# Patient Record
Sex: Female | Born: 1995 | Race: White | Hispanic: No | Marital: Married | Smoking: Current every day smoker
Health system: Southern US, Community
[De-identification: ages and names within clinical notes are randomized; demographics above are authoritative.]

## PROBLEM LIST (undated history)

## (undated) DIAGNOSIS — J45909 Unspecified asthma, uncomplicated: Secondary | ICD-10-CM

---
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
## 2015-06-05 ENCOUNTER — Emergency Department (HOSPITAL_COMMUNITY): Payer: Managed Care, Other (non HMO)

## 2015-06-05 ENCOUNTER — Encounter (HOSPITAL_COMMUNITY): Payer: Self-pay | Admitting: Emergency Medicine

## 2015-06-05 ENCOUNTER — Emergency Department (HOSPITAL_COMMUNITY)
Admission: EM | Admit: 2015-06-05 | Discharge: 2015-06-05 | Disposition: A | Discharge status: Home / Self Care | Payer: Managed Care, Other (non HMO) | Attending: Emergency Medicine | Admitting: Emergency Medicine

## 2015-06-05 DIAGNOSIS — S62309A Unspecified fracture of unspecified metacarpal bone, initial encounter for closed fracture: Secondary | ICD-10-CM

## 2015-06-05 DIAGNOSIS — Y9289 Other specified places as the place of occurrence of the external cause: Secondary | ICD-10-CM | POA: Insufficient documentation

## 2015-06-05 DIAGNOSIS — S62354A Nondisplaced fracture of shaft of fourth metacarpal bone, right hand, initial encounter for closed fracture: Secondary | ICD-10-CM | POA: Insufficient documentation

## 2015-06-05 DIAGNOSIS — F17200 Nicotine dependence, unspecified, uncomplicated: Secondary | ICD-10-CM | POA: Diagnosis not present

## 2015-06-05 DIAGNOSIS — Y998 Other external cause status: Secondary | ICD-10-CM | POA: Diagnosis not present

## 2015-06-05 DIAGNOSIS — S6991XA Unspecified injury of right wrist, hand and finger(s), initial encounter: Secondary | ICD-10-CM | POA: Diagnosis present

## 2015-06-05 DIAGNOSIS — X58XXXA Exposure to other specified factors, initial encounter: Secondary | ICD-10-CM | POA: Insufficient documentation

## 2015-06-05 DIAGNOSIS — Z79899 Other long term (current) drug therapy: Secondary | ICD-10-CM | POA: Insufficient documentation

## 2015-06-05 DIAGNOSIS — Y9389 Activity, other specified: Secondary | ICD-10-CM | POA: Diagnosis not present

## 2015-06-05 HISTORY — DX: Unspecified asthma, uncomplicated: J45.909

## 2015-06-05 MED ORDER — HYDROCODONE-ACETAMINOPHEN 5-325 MG PO TABS: 1.0000 | ORAL_TABLET | Freq: Four times a day (QID) | ORAL | Status: DC | PRN

## 2015-06-05 MED ORDER — NAPROXEN 500 MG PO TABS
500.0000 mg | ORAL_TABLET | Freq: Two times a day (BID) | ORAL | Status: DC
Start: 2015-06-05 — End: 2022-12-30

## 2015-06-05 MED ORDER — HYDROCODONE-ACETAMINOPHEN 5-325 MG PO TABS
1.0000 | ORAL_TABLET | Freq: Once | ORAL | Status: AC
  Administered 2015-06-05: 1 via ORAL
  Filled 2015-06-05: qty 1

## 2015-06-05 NOTE — ED Notes (Addendum)
Patient states that she was opening a door and she states that she moved wrong, heard a crack, has pain in right 4th finger, right wrist and right forearm.  No deformity noted, but areas are tender to the touch.

## 2015-06-05 NOTE — ED Provider Notes (Signed)
CSN: [id number]     Arrival date & time 06/05/15  9629 History   First MD Initiated Contact with Patient 06/05/15 [phone]     Chief Complaint  Patient presents with  . Finger Injury     (Consider location/radiation/quality/duration/timing/severity/associated sxs/prior Treatment) HPI Comments: Pt comes in with cc of finger injury. She is R handed, and injured the R finger while trying to open a door. Injury occurred prior to ER arrival. Pt has pain at the base of the R ring finger, shooting up and down. No numbness, tingling.  The history is provided by the patient.    Past Medical History  Diagnosis Date  . Asthma    History reviewed. No pertinent past surgical history. No family history on file. Social History  Substance Use Topics  . Smoking status: Current Every Day Smoker  . Smokeless tobacco: None  . Alcohol Use: Yes     Comment: socially   OB History    No data available     Review of Systems  Constitutional: Positive for activity change.  Musculoskeletal: Positive for myalgias and arthralgias.  Skin: Positive for wound.  Hematological: Does not bruise/bleed easily.      Allergies  Review of patient's allergies indicates no known allergies.  Home Medications   Prior to Admission medications   Medication Sig Start Date End Date Taking? Authorizing Provider  PROAIR HFA 108 [phone] Base) MCG/ACT inhaler Inhale 2 puffs into the lungs every 4 (four) hours as needed for wheezing.  05/27/15  Yes Historical Provider, MD  HYDROcodone-acetaminophen (NORCO/VICODIN) 5-325 MG tablet Take 1 tablet by mouth every 6 (six) hours as needed. 06/05/15   Derwood Kaplan, MD  naproxen (NAPROSYN) 500 MG tablet Take 1 tablet (500 mg total) by mouth 2 (two) times daily. 06/05/15   Archer Vise, MD   BP 139/99 mmHg  Pulse 85  Temp(Src) 98.6 F (37 C) (Oral)  Resp 16  Ht 5' (1.524 m)  Wt 176 lb 1 oz (79.861 kg)  BMI 34.38 kg/m2  SpO2 99%  LMP 05/07/2015 (Approximate) Physical Exam   Constitutional: She is oriented to person, place, and time. She appears well-developed.  HENT:  Head: Normocephalic and atraumatic.  Eyes: EOM are normal.  Neck: Normal range of motion. Neck supple.  Cardiovascular: Normal rate.   Pulmonary/Chest: Effort normal.  Abdominal: Bowel sounds are normal.  Musculoskeletal:  Tenderness over the metacarpal region of the R hand. Neurovascularly intact  Neurological: She is alert and oriented to person, place, and time.  Skin: Skin is warm and dry.  Nursing note and vitals reviewed.   ED Course  Procedures (including critical care time) Labs Review Labs Reviewed - No data to display  Imaging Review Dg Forearm Right  06/05/2015  CLINICAL DATA:  Injury opening door tonight with right ring finger pain. Initial encounter. EXAM: RIGHT FOREARM - 2 VIEW COMPARISON:  None. FINDINGS: Dorsal forearm soft tissue swelling is suspected. No forearm fracture or subluxation. Nondisplaced fourth metacarpal shaft fracture as described on dedicated imaging. IMPRESSION: No osseous abnormality at the forearm. Electronically Signed   By: Marnee Spring M.D.   On: 06/05/2015 05:14   Dg Wrist Complete Right  06/05/2015  CLINICAL DATA:  Injury opening door tonight with right ring finger pain. Initial encounter. EXAM: RIGHT WRIST - COMPLETE 3+ VIEW COMPARISON:  None. FINDINGS: Nondisplaced fourth metacarpal shaft fracture. No fracture or malalignment at the wrist. IMPRESSION: 1. Negative wrist. 2. Nondisplaced fourth metacarpal shaft fracture. Electronically Signed   By:  Marnee SpringJonathon  Watts M.D.   On: 06/05/2015 05:12   Dg Hand Complete Right  06/05/2015  CLINICAL DATA:  Injury opening door tonight with right ring finger pain. Initial encounter. EXAM: RIGHT HAND - COMPLETE 3+ VIEW COMPARISON:  None. FINDINGS: Nondisplaced oblique fracture of the fourth metacarpal shaft. There is an additional more proximal component, but no base or CMC involvement is visualized. No  dislocation. IMPRESSION: Nondisplaced fourth metacarpal shaft fracture. Electronically Signed   By: Marnee SpringJonathon  Watts M.D.   On: 06/05/2015 05:11   I have personally reviewed and evaluated these images and lab results as part of my medical decision-making.   EKG Interpretation None      MDM   Final diagnoses:  Metacarpal bone fracture, closed, initial encounter    SPLINT APPLICATION Date/Time: 6:47 AM Authorized by: Derwood KaplanNanavati, Lemont Sitzmann Consent: Verbal consent obtained. Risks and benefits: risks, benefits and alternatives were discussed Consent given by: patient Splint applied by: orthopedic technician Location details: R hand Splint type: Ulnar gutter Supplies used: ace wrap, tape, plaster Post-procedure: The splinted body part was neurovascularly unchanged following the procedure. Patient tolerance: Patient tolerated the procedure well with no immediate complications.    Pt with mechanical injury resulting in the hand fracture. Will d.c with hand f/u. Neurovascularly intact.    Derwood KaplanAnkit Karyss Frese, MD 06/05/15 [phone]

## 2015-06-05 NOTE — Discharge Instructions (Signed)
Metacarpal Fracture °A metacarpal fracture is a break (fracture) of a bone in the hand. Metacarpals are the bones that extend from your knuckles to your wrist. In each hand, you have five metacarpal bones that connect your fingers and your thumb to your wrist. °Some hand fractures have bone pieces that are close together and stable (simple). These fractures may be treated with only a splint or cast. Hand fractures that have many pieces of broken bone (comminuted), unstable bone pieces (displaced), or a bone that breaks through the skin (compound) usually require surgery. °CAUSES °This injury may be caused by: °· A fall. °· A hard, direct hit to your hand. °· An injury that squeezes your knuckle, stretches your finger out of place, or crushes your hand. °RISK FACTORS °This injury is more likely to occur if: °· You play contact sports. °· You have certain bone diseases. °SYMPTOMS  °Symptoms of this type of fracture develop soon after the injury. Symptoms may include: °· Swelling. °· Pain. °· Stiffness. °· Increased pain with movement. °· Bruising. °· Inability to move a finger. °· A shortened finger. °· A finger knuckle that looks sunken in. °· Unusual appearance of the hand or finger (deformity). °DIAGNOSIS  °This injury may be diagnosed based on your signs and symptoms, especially if you had a recent hand injury. Your health care provider will perform a physical exam. He or she may also order X-rays to confirm the diagnosis.  °TREATMENT  °Treatment for this injury depends on the type of fracture you have and how severe it is. Possible treatments include: °· Non-reduction. This can be done if the bone does not need to be moved back into place. The fracture can be casted or splinted as it is.   °· Closed reduction. If your bone is stable and can be moved back into place, you may only need to wear a cast or splint or have buddy taping. °· Closed reduction with internal fixation (CRIF). This is the most common  treatment. You may have this procedure if your bone can be moved back into place but needs more support. Wires, pins, or screws may be inserted through your skin to stabilize the fracture. °· Open reduction with internal fixation (ORIF). This may be needed if your fracture is severe and unstable. It involves surgery to move your bone back into the right position. Screws, wires, or plates are used to stabilize the fracture. °After all procedures, you may need to wear a cast or a splint for several weeks. You will also need to have follow-up X-rays to make sure that the bone is healing well and staying in position. After you no longer need your cast or splint, you may need physical therapy. This will help you to regain full movement and strength in your hand.  °HOME CARE INSTRUCTIONS  °If You Have a Cast: °· Do not stick anything inside the cast to scratch your skin. Doing that increases your risk of infection. °· Check the skin around the cast every day. Report any concerns to your health care provider. You may put lotion on dry skin around the edges of the cast. Do not apply lotion to the skin underneath the cast. °If You Have a Splint: °· Wear it as directed by your health care provider. Remove it only as directed by your health care provider. °· Loosen the splint if your fingers become numb and tingle, or if they turn cold and blue. °Bathing °· Cover the cast or splint with a   watertight plastic bag to protect it from water while you take a bath or a shower. Do not let the cast or splint get wet. Managing Pain, Stiffness, and Swelling  If directed, apply ice to the injured area (if you have a splint, not a cast):  Put ice in a plastic bag.  Place a towel between your skin and the bag.  Leave the ice on for 20 minutes, 2-3 times a day.  Move your fingers often to avoid stiffness and to lessen swelling.  Raise the injured area above the level of your heart while you are sitting or lying  down. Driving  Do not drive or operate heavy machinery while taking pain medicine.  Do not drive while wearing a cast or splint on a hand that you use for driving. Activity  Return to your normal activities as directed by your health care provider. Ask your health care provider what activities are safe for you. General Instructions  Do not put pressure on any part of the cast or splint until it is fully hardened. This may take several hours.  Keep the cast or splint clean and dry.  Do not use any tobacco products, including cigarettes, chewing tobacco, or electronic cigarettes. Tobacco can delay bone healing. If you need help quitting, ask your health care provider.  Take medicines only as directed by your health care provider.  Keep all follow-up visits as directed by your health care provider. This is important. SEEK MEDICAL CARE IF:   Your pain is getting worse.  You have redness, swelling, or pain in the injured area.   You have fluid, blood, or pus coming from under your cast or splint.   You notice a bad smell coming from under your cast or splint.   You have a fever.  SEEK IMMEDIATE MEDICAL CARE IF:   You develop a rash.   You have trouble breathing.   Your skin or nails on your injured hand turn blue or gray even after you loosen your splint.  Your injured hand feels cold or becomes numb even after you loosen your splint.   You develop severe pain under the cast or in your hand.   This information is not intended to replace advice given to you by your health care provider. Make sure you discuss any questions you have with your health care provider.   Document Released: 01/26/2005 Document Revised: 10/17/2014 Document Reviewed: 11/15/2013 Elsevier Interactive Patient Education 2016 [employer]  Cast or Splint Care Casts and splints support injured limbs and keep bones from moving while they heal.  HOME CARE  Keep the cast or splint uncovered during  the drying period.  A plaster cast can take 24 to 48 hours to dry.  A fiberglass cast will dry in less than 1 hour.  Do not rest the cast on anything harder than a pillow for 24 hours.  Do not put weight on your injured limb. Do not put pressure on the cast. Wait for your doctor's approval.  Keep the cast or splint dry.  Cover the cast or splint with a plastic bag during baths or wet weather.  If you have a cast over your chest and belly (trunk), take sponge baths until the cast is taken off.  If your cast gets wet, dry it with a towel or blow dryer. Use the cool setting on the blow dryer.  Keep your cast or splint clean. Wash a dirty cast with a damp cloth.  Do not  put any objects under your cast or splint.  Do not scratch the skin under the cast with an object. If itching is a problem, use a blow dryer on a cool setting over the itchy area.  Do not trim or cut your cast.  Do not take out the padding from inside your cast.  Exercise your joints near the cast as told by your doctor.  Raise (elevate) your injured limb on 1 or 2 pillows for the first 1 to 3 days. GET HELP IF:  Your cast or splint cracks.  Your cast or splint is too tight or too loose.  You itch badly under the cast.  Your cast gets wet or has a soft spot.  You have a bad smell coming from the cast.  You get an object stuck under the cast.  Your skin around the cast becomes red or sore.  You have new or more pain after the cast is put on. GET HELP RIGHT AWAY IF:  You have fluid leaking through the cast.  You cannot move your fingers or toes.  Your fingers or toes turn blue or white or are cool, painful, or puffy (swollen).  You have tingling or lose feeling (numbness) around the injured area.  You have bad pain or pressure under the cast.  You have trouble breathing or have shortness of breath.  You have chest pain.   This information is not intended to replace advice given to you by your  health care provider. Make sure you discuss any questions you have with your health care provider.   Document Released: 05/28/2010 Document Revised: 09/28/2012 Document Reviewed: 08/04/2012 Elsevier Interactive Patient Education [employer]

## 2017-03-10 ENCOUNTER — Other Ambulatory Visit: Payer: Self-pay

## 2017-03-10 ENCOUNTER — Emergency Department (HOSPITAL_COMMUNITY): Payer: Self-pay

## 2017-03-10 ENCOUNTER — Emergency Department (HOSPITAL_COMMUNITY)
Admission: EM | Admit: 2017-03-10 | Discharge: 2017-03-11 | Disposition: A | Discharge status: Home / Self Care | Payer: Self-pay | Attending: Emergency Medicine | Admitting: Emergency Medicine

## 2017-03-10 DIAGNOSIS — F17200 Nicotine dependence, unspecified, uncomplicated: Secondary | ICD-10-CM | POA: Insufficient documentation

## 2017-03-10 DIAGNOSIS — J4521 Mild intermittent asthma with (acute) exacerbation: Secondary | ICD-10-CM | POA: Insufficient documentation

## 2017-03-10 DIAGNOSIS — J069 Acute upper respiratory infection, unspecified: Secondary | ICD-10-CM | POA: Insufficient documentation

## 2017-03-10 LAB — CBC WITH DIFFERENTIAL/PLATELET
Basophils Absolute: 0 10*3/uL (ref 0.0–0.1)
Basophils Relative: 0 %
EOS ABS: 0 10*3/uL (ref 0.0–0.7)
Eosinophils Relative: 0 %
HCT: 42.8 % (ref 36.0–46.0)
Hemoglobin: 14.8 g/dL (ref 12.0–15.0)
LYMPHS ABS: 1.1 10*3/uL (ref 0.7–4.0)
Lymphocytes Relative: 17 %
MCH: 30.6 pg (ref 26.0–34.0)
MCHC: 34.6 g/dL (ref 30.0–36.0)
MCV: 88.4 fL (ref 78.0–100.0)
MONOS PCT: 9 %
Monocytes Absolute: 0.6 10*3/uL (ref 0.1–1.0)
Neutro Abs: 4.7 10*3/uL (ref 1.7–7.7)
Neutrophils Relative %: 74 %
Platelets: 175 10*3/uL (ref 150–400)
RBC: 4.84 MIL/uL (ref 3.87–5.11)
RDW: 13.9 % (ref 11.5–15.5)
WBC: 6.5 10*3/uL (ref 4.0–10.5)

## 2017-03-10 LAB — URINALYSIS, ROUTINE W REFLEX MICROSCOPIC
Bilirubin Urine: NEGATIVE
GLUCOSE, UA: NEGATIVE mg/dL
Hgb urine dipstick: NEGATIVE
Ketones, ur: NEGATIVE mg/dL
Leukocytes, UA: NEGATIVE
Nitrite: NEGATIVE
PH: 5 (ref 5.0–8.0)
PROTEIN: NEGATIVE mg/dL
Specific Gravity, Urine: 1.031 — ABNORMAL HIGH (ref 1.005–1.030)

## 2017-03-10 LAB — BASIC METABOLIC PANEL
Anion gap: 11 (ref 5–15)
BUN: 8 mg/dL (ref 6–20)
CO2: 21 mmol/L — ABNORMAL LOW (ref 22–32)
CREATININE: 0.97 mg/dL (ref 0.44–1.00)
Calcium: 9 mg/dL (ref 8.9–10.3)
Chloride: 105 mmol/L (ref 101–111)
GFR calc Af Amer: >60 mL/min (ref >60)
Glucose, Bld: 113 mg/dL — ABNORMAL HIGH (ref 65–99)
Potassium: 3.5 mmol/L (ref 3.5–5.1)
SODIUM: 137 mmol/L (ref 135–145)

## 2017-03-10 LAB — I-STAT BETA HCG BLOOD, ED (MC, WL, AP ONLY)

## 2017-03-10 MED ORDER — PREDNISONE 20 MG PO TABS
60.0000 mg | ORAL_TABLET | Freq: Once | ORAL | Status: AC
  Administered 2017-03-11: 60 mg via ORAL
  Filled 2017-03-10: qty 3

## 2017-03-10 MED ORDER — ALBUTEROL SULFATE (2.5 MG/3ML) 0.083% IN NEBU: 2.5000 mg | INHALATION_SOLUTION | Freq: Once | RESPIRATORY_TRACT | Status: DC

## 2017-03-10 MED ORDER — ALBUTEROL SULFATE (2.5 MG/3ML) 0.083% IN NEBU
5.0000 mg | INHALATION_SOLUTION | Freq: Once | RESPIRATORY_TRACT | Status: AC
  Administered 2017-03-10: 5 mg via RESPIRATORY_TRACT
  Filled 2017-03-10: qty 6

## 2017-03-10 MED ORDER — IPRATROPIUM-ALBUTEROL 0.5-2.5 (3) MG/3ML IN SOLN
3.0000 mL | Freq: Once | RESPIRATORY_TRACT | Status: AC
  Administered 2017-03-11: 3 mL via RESPIRATORY_TRACT
  Filled 2017-03-10: qty 3

## 2017-03-10 NOTE — ED Triage Notes (Signed)
Pt having generalized body ache 9/10, chest congestion, and asthma attack for the past 3 days using inhalers with no relief.

## 2017-03-11 MED ORDER — PREDNISONE 20 MG PO TABS: 40.0000 mg | ORAL_TABLET | Freq: Every day | ORAL | 0 refills | Status: AC

## 2017-03-11 NOTE — ED Provider Notes (Signed)
[facility] EMERGENCY DEPARTMENT Provider Note   CSN: [id number] Arrival date & time: 03/10/17  2034     History   Chief Complaint Chief Complaint  Patient presents with  . Nasal Congestion    HPI Maria Woodward is a 22 y.o. female with past medical history of asthma, presenting to the ED with 1 day of URI symptoms.  Patient states she began having a dry cough, with congestion, sore throat, and body aches since yesterday.  She states her asthma has been acting up, not improved with albuterol at home.  She denies fever, stating her temperature was 99 F at the highest.  Denies difficulty swallowing, recent antibiotics, or any other complaints.  Patient was given albuterol neb in triage, which she reports provided her with some relief of symptoms, however she is still feeling as though she is not at her baseline.  The history is provided by the patient.    Past Medical History:  Diagnosis Date  . Asthma     There are no active problems to display for this patient.   No past surgical history on file.  OB History    No data available       Home Medications    Prior to Admission medications   Medication Sig Start Date End Date Taking? Authorizing Provider  HYDROcodone-acetaminophen (NORCO/VICODIN) 5-325 MG tablet Take 1 tablet by mouth every 6 (six) hours as needed. 06/05/15   Derwood KaplanNanavati, Ankit, MD  naproxen (NAPROSYN) 500 MG tablet Take 1 tablet (500 mg total) by mouth 2 (two) times daily. 06/05/15   Derwood KaplanNanavati, Ankit, MD  predniSONE (DELTASONE) 20 MG tablet Take 2 tablets (40 mg total) by mouth daily for 5 days. 03/11/17 03/16/17  Rocky Gladden, [place] N, PA-C  PROAIR HFA 108 [phone](90 Base) MCG/ACT inhaler Inhale 2 puffs into the lungs every 4 (four) hours as needed for wheezing.  05/27/15   [provider]    Family History No family history on file.  Social History Social History   Tobacco Use  . Smoking status: Current Every Day Smoker  Substance Use  Topics  . Alcohol use: Yes    Comment: socially  . Drug use: No     Allergies   Patient has no known allergies.   Review of Systems Review of Systems  Constitutional: Negative for chills and fever.  HENT: Positive for congestion and sore throat. Negative for ear pain, trouble swallowing and voice change.   Respiratory: Positive for cough, shortness of breath and wheezing.   Musculoskeletal: Positive for myalgias (generalized).  All other systems reviewed and are negative.    Physical Exam Updated Vital Signs BP (!) 175/101 (BP Location: Right Arm)   Pulse 100   Temp 99 F (37.2 C) (Oral)   Resp 18   Ht 5' (1.524 m)   Wt 81.6 kg (180 lb)   LMP 03/06/2017   SpO2 100%   BMI 35.15 kg/m   Physical Exam  Constitutional: She appears well-developed and well-nourished. No distress.  Tolerating secretions.  No increased work of breathing.  HENT:  Head: Normocephalic and atraumatic.  Right Ear: Tympanic membrane, external ear and ear canal normal.  Left Ear: Tympanic membrane, external ear and ear canal normal.  Nose: Nose normal.  Mouth/Throat: Uvula is midline. No trismus in the jaw. No uvula swelling.  Oropharyngeal erythema, no edema or exudates.  Eyes: Conjunctivae are normal.  Neck: Normal range of motion. Neck supple. No tracheal deviation present.  Cardiovascular: Regular rhythm,  normal heart sounds and intact distal pulses.  Slightly tachycardic  Pulmonary/Chest: Effort normal. No stridor. No respiratory distress. She has wheezes (bilateral). She has no rales.  Abdominal: Soft.  Lymphadenopathy:    She has no cervical adenopathy.  Neurological: She is alert.  Skin: Skin is warm.  Psychiatric: She has a normal mood and affect. Her behavior is normal.  Nursing note and vitals reviewed.    ED Treatments / Results  Labs (all labs ordered are listed, but only abnormal results are displayed) Labs Reviewed  BASIC METABOLIC PANEL - Abnormal; Notable for the  following components:      Result Value   CO2 21 (*)    Glucose, Bld 113 (*)    All other components within normal limits  URINALYSIS, ROUTINE W REFLEX MICROSCOPIC - Abnormal; Notable for the following components:   APPearance CLOUDY (*)    Specific Gravity, Urine 1.031 (*)    Bacteria, UA RARE (*)    Squamous Epithelial / LPF 6-30 (*)    All other components within normal limits  CBC WITH DIFFERENTIAL/PLATELET  I-STAT BETA HCG BLOOD, ED ([facility], [facility], [facility] ONLY)    EKG  EKG Interpretation None       Radiology Dg Chest 2 View  Result Date: 03/10/2017 CLINICAL DATA:  Shortness of breath. Generalized body aches. Chest congestion. Asthma attack for 3 days. No relief with inhalers. EXAM: CHEST  2 VIEW COMPARISON:  08/23/2011 FINDINGS: Normal heart size and pulmonary vascularity. No focal airspace disease or consolidation in the lungs. No blunting of costophrenic angles. No pneumothorax. Mediastinal contours appear intact. Presumed foreign object projected over the right side of the neck. Radiopaque piercings over the breasts. IMPRESSION: No active cardiopulmonary disease. Electronically Signed   By: Burman Nieves M.D.   On: 03/10/2017 21:26    Procedures Procedures (including critical care time)  Medications Ordered in ED Medications  albuterol (PROVENTIL) (2.5 MG/3ML) 0.083% nebulizer solution 5 mg (5 mg Nebulization Given 03/10/17 2054)  ipratropium-albuterol (DUONEB) 0.5-2.5 (3) MG/3ML nebulizer solution 3 mL (3 mLs Nebulization Given 03/11/17 0007)  predniSONE (DELTASONE) tablet 60 mg (60 mg Oral Given 03/11/17 0007)     Initial Impression / Assessment and Plan / ED Course  I have reviewed the triage vital signs and the nursing notes.  Pertinent labs & imaging results that were available during my care of the patient were reviewed by me and considered in my medical decision making (see chart for details).  Clinical Course as of Mar 11 37  Thu Mar 11, 2017  0026 Pt reports  significant improvement in breathing after duoneb. Lungs clear on re-evaluation. Pt tolerating PO fluids in ED.  [JR]    Clinical Course User Index [JR] Jahziah Simonin, [place] N, PA-C    Pt presenting w URI sx and asthma exacerbation. ENT exam reassuring. Lungs w wheezing bilaterally. Tolerating secretions, afebrile, mildly tachycardic. CXR neg. Labs done in triage wnl. Duo neb, prednisone and PO liquids given. On re-evaluation, pt reports she feels much better and is at her baseline for breathing. Wheezing significantly improved on exam with better air movement. O2 sat 100% on RA. Pt drank >8oz liquids in ED with improvement in HR. Pt not in distress, safe for discharge at this time. Will give rx for prednisone for asthma. Also recommend PCP follow up to recheck BP. Agreeable to plan.   Discussed results, findings, treatment and follow up. Patient advised of return precautions. Patient verbalized understanding and agreed with plan.  Final Clinical Impressions(s) /  ED Diagnoses   Final diagnoses:  Exacerbation of intermittent asthma, unspecified asthma severity  Viral URI    ED Discharge Orders        Ordered    predniSONE (DELTASONE) 20 MG tablet  Daily     03/11/17 0038       Lamekia Nolden, [place] N, PA-C 03/11/17 0041    Zadie Rhine, MD 03/11/17 [phone]

## 2017-03-11 NOTE — Discharge Instructions (Signed)
Please read instructions below.  You can take tylenol/acetaminophen or advil/ibuprofen as needed for sore throat or body aches.  You can use saline nasal spray as needed for congestion. Drink plenty of water to stay hydrated. Tomorrow, begin taking the prednisone as prescribed for your asthma. Use your albuterol inhaler every 4-6 hrs as needed. Follow up with your primary care provider if symptoms persist, and to recheck your blood pressure as it was high today. Return to the ER for difficulty swallowing liquids, difficulty breathing, or new or worsening symptoms.

## 2019-06-05 IMAGING — CR DG CHEST 2V
2 series · 2 of 2 positions shown · non-contrast
Comparison: 08/23/2011

CLINICAL DATA: Shortness of breath. Generalized body aches. Chest
congestion. Asthma attack for 3 days. No relief with inhalers.

EXAM:
CHEST  2 VIEW

[chest pa]
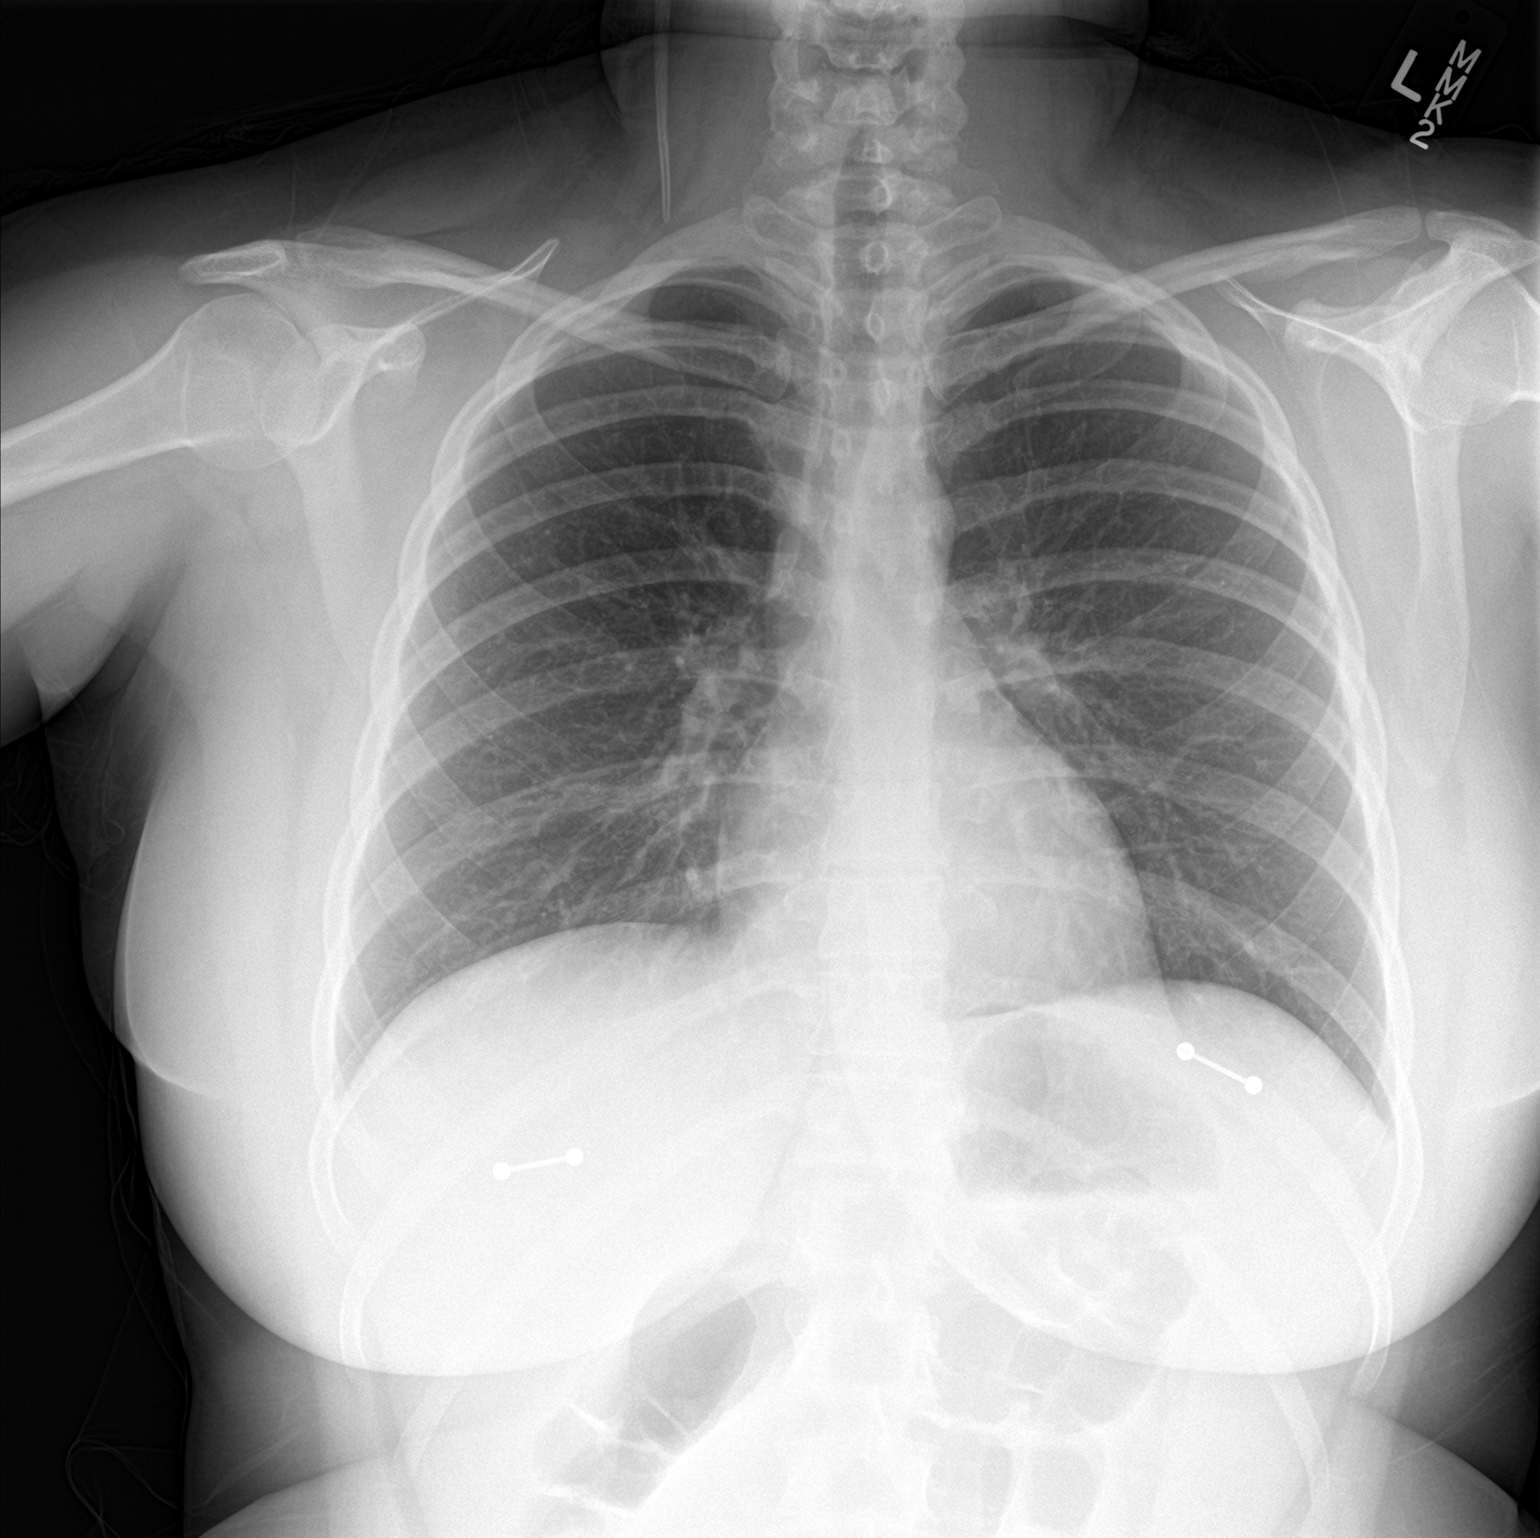

[chest lat]
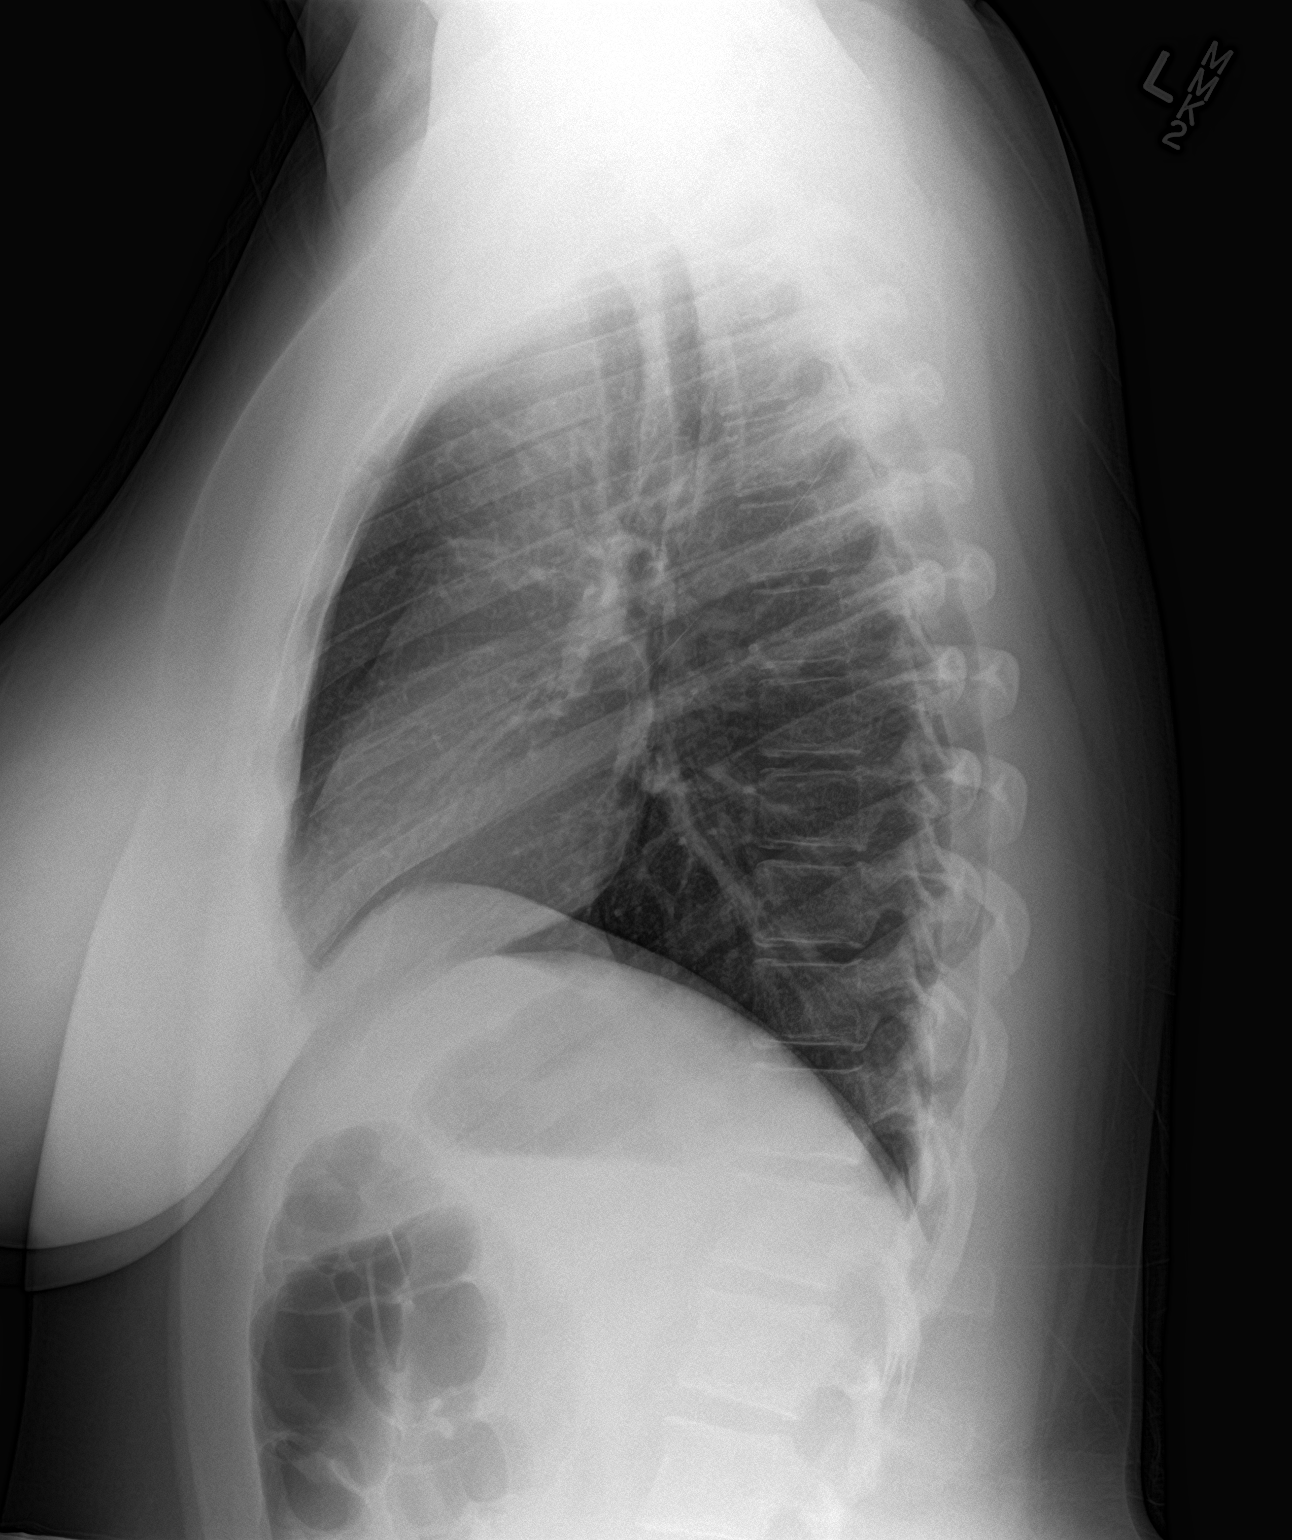

[2 of 2 positions shown; findings below may reference images not displayed]

FINDINGS: Normal heart size and pulmonary vascularity. No focal airspace
disease or consolidation in the lungs. No blunting of costophrenic
angles. No pneumothorax. Mediastinal contours appear intact.
Presumed foreign object projected over the right side of the neck.
Radiopaque piercings over the breasts.
IMPRESSION: No active cardiopulmonary disease.

## 2020-01-22 ENCOUNTER — Ambulatory Visit (HOSPITAL_COMMUNITY)
Admission: EM | Admit: 2020-01-22 | Discharge: 2020-01-22 | Disposition: A | Discharge status: Home / Self Care | Payer: 59 | Attending: Emergency Medicine | Admitting: Emergency Medicine

## 2020-01-22 ENCOUNTER — Other Ambulatory Visit: Payer: Self-pay

## 2020-01-22 ENCOUNTER — Encounter (HOSPITAL_COMMUNITY): Payer: Self-pay

## 2020-01-22 DIAGNOSIS — Z79899 Other long term (current) drug therapy: Secondary | ICD-10-CM | POA: Insufficient documentation

## 2020-01-22 DIAGNOSIS — Z20822 Contact with and (suspected) exposure to covid-19: Secondary | ICD-10-CM | POA: Diagnosis not present

## 2020-01-22 DIAGNOSIS — Z791 Long term (current) use of non-steroidal anti-inflammatories (NSAID): Secondary | ICD-10-CM | POA: Diagnosis not present

## 2020-01-22 DIAGNOSIS — B349 Viral infection, unspecified: Secondary | ICD-10-CM | POA: Insufficient documentation

## 2020-01-22 DIAGNOSIS — F17200 Nicotine dependence, unspecified, uncomplicated: Secondary | ICD-10-CM | POA: Diagnosis not present

## 2020-01-22 DIAGNOSIS — R030 Elevated blood-pressure reading, without diagnosis of hypertension: Secondary | ICD-10-CM | POA: Insufficient documentation

## 2020-01-22 DIAGNOSIS — R112 Nausea with vomiting, unspecified: Secondary | ICD-10-CM | POA: Diagnosis not present

## 2020-01-22 DIAGNOSIS — J45909 Unspecified asthma, uncomplicated: Secondary | ICD-10-CM | POA: Insufficient documentation

## 2020-01-22 LAB — RESP PANEL BY RT-PCR (FLU A&B, COVID) ARPGX2
Influenza A by PCR: NEGATIVE
Influenza B by PCR: NEGATIVE
SARS Coronavirus 2 by RT PCR: NEGATIVE

## 2020-01-22 MED ORDER — ONDANSETRON 4 MG PO TBDP
ORAL_TABLET | ORAL | Status: AC
  Filled 2020-01-22: qty 1

## 2020-01-22 MED ORDER — ONDANSETRON 4 MG PO TBDP
4.0000 mg | ORAL_TABLET | Freq: Once | ORAL | Status: AC
  Administered 2020-01-22: 4 mg via ORAL

## 2020-01-22 MED ORDER — ONDANSETRON HCL 4 MG PO TABS: 4.0000 mg | ORAL_TABLET | Freq: Four times a day (QID) | ORAL | 0 refills | Status: DC | PRN

## 2020-01-22 NOTE — Discharge Instructions (Signed)
Take the antinausea medication as directed.    Keep yourself hydrated with clear liquids, such as water, Gatorade, Pedialyte, Sprite, or ginger ale.    Go to the emergency department if you have acute worsening symptoms.    Your COVID and Flu tests are pending.  You should self quarantine until the test results are back.    Take Tylenol as needed for fever or discomfort.  Rest and keep yourself hydrated.    Follow-up with your primary care provider if your symptoms are not improving.    Your blood pressure is elevated today at 155/108.  Please have this rechecked by your primary care provider in 1-2 weeks.

## 2020-01-22 NOTE — ED Provider Notes (Signed)
[facility]    CSN: [id number] Arrival date & time: 01/22/20  1448      History   Chief Complaint Chief Complaint  Patient presents with  . URI    HPI Maria Woodward is a 24 y.o. female.   Patient presents with 4-5 day history of runny nose, congestion, sinus pressure, cough, and chills.  She also reports nausea, vomiting, decreased oral intake; no emesis today, last episode yesterday.  She denies fever, rash, shortness of breath, abdominal pain, diarrhea, or other symptoms.  Treatment attempted at home with NyQuil.  Her medical history includes asthma.  The history is provided by the patient and medical records.    Past Medical History:  Diagnosis Date  . Asthma     There are no problems to display for this patient.   History reviewed. No pertinent surgical history.  OB History   No obstetric history on file.      Home Medications    Prior to Admission medications   Medication Sig Start Date End Date Taking? Authorizing Provider  HYDROcodone-acetaminophen (NORCO/VICODIN) 5-325 MG tablet Take 1 tablet by mouth every 6 (six) hours as needed. 06/05/15   Derwood Kaplan, MD  naproxen (NAPROSYN) 500 MG tablet Take 1 tablet (500 mg total) by mouth 2 (two) times daily. 06/05/15   Derwood Kaplan, MD  ondansetron (ZOFRAN) 4 MG tablet Take 1 tablet (4 mg total) by mouth every 6 (six) hours as needed for nausea or vomiting. 01/22/20   Mickie Bail, NP  PROAIR HFA 108 [phone] Base) MCG/ACT inhaler Inhale 2 puffs into the lungs every 4 (four) hours as needed for wheezing.  05/27/15   [provider]    Family History History reviewed. No pertinent family history.  Social History Social History   Tobacco Use  . Smoking status: Current Every Day Smoker  Substance Use Topics  . Alcohol use: Yes    Comment: socially  . Drug use: No     Allergies   Patient has no known allergies.   Review of Systems Review of Systems  Constitutional: Positive for  chills. Negative for fever.  HENT: Positive for congestion, postnasal drip, rhinorrhea and sinus pressure. Negative for ear pain and sore throat.   Eyes: Negative for pain and visual disturbance.  Respiratory: Positive for cough. Negative for shortness of breath.   Cardiovascular: Negative for chest pain and palpitations.  Gastrointestinal: Positive for nausea and vomiting. Negative for abdominal pain and diarrhea.  Genitourinary: Negative for dysuria and hematuria.  Musculoskeletal: Negative for arthralgias and back pain.  Skin: Negative for color change and rash.  Neurological: Negative for seizures and syncope.  All other systems reviewed and are negative.    Physical Exam Triage Vital Signs ED Triage Vitals  Enc Vitals Group     BP      Pulse      Resp      Temp      Temp src      SpO2      Weight      Height      Head Circumference      Peak Flow      Pain Score      Pain Loc      Pain Edu?      Excl. in GC?    No data found.  Updated Vital Signs BP (!) 155/108 (BP Location: Right Arm)   Pulse 67   Temp 97.8 F (36.6 C) (Oral)  Resp 17   LMP 01/13/2020   SpO2 100%   Visual Acuity Right Eye Distance:   Left Eye Distance:   Bilateral Distance:    Right Eye Near:   Left Eye Near:    Bilateral Near:     Physical Exam Vitals and nursing note reviewed.  Constitutional:      General: She is not in acute distress.    Appearance: She is well-developed and well-nourished.  HENT:     Head: Normocephalic and atraumatic.     Right Ear: Tympanic membrane normal.     Left Ear: Tympanic membrane normal.     Nose: Nose normal.     Mouth/Throat:     Mouth: Mucous membranes are moist.     Pharynx: Oropharynx is clear.  Eyes:     Conjunctiva/sclera: Conjunctivae normal.  Cardiovascular:     Rate and Rhythm: Normal rate and regular rhythm.     Heart sounds: Normal heart sounds.  Pulmonary:     Effort: Pulmonary effort is normal. No respiratory distress.      Breath sounds: Normal breath sounds.  Abdominal:     General: Bowel sounds are normal.     Palpations: Abdomen is soft.     Tenderness: There is no abdominal tenderness. There is no right CVA tenderness, left CVA tenderness, guarding or rebound.  Musculoskeletal:        General: No edema.     Cervical back: Neck supple.  Skin:    General: Skin is warm and dry.     Findings: No rash.  Neurological:     General: No focal deficit present.     Mental Status: She is alert and oriented to person, place, and time.     Gait: Gait normal.  Psychiatric:        Mood and Affect: Mood and affect and mood normal.        Behavior: Behavior normal.      UC Treatments / Results  Labs (all labs ordered are listed, but only abnormal results are displayed) Labs Reviewed  RESP PANEL BY RT-PCR (FLU A&B, COVID) [id number]    EKG   Radiology No results found.  Procedures Procedures (including critical care time)  Medications Ordered in UC Medications  ondansetron (ZOFRAN-ODT) disintegrating tablet 4 mg (4 mg Oral Given 01/22/20 1801)    Initial Impression / Assessment and Plan / UC Course  I have reviewed the triage vital signs and the nursing notes.  Pertinent labs & imaging results that were available during my care of the patient were reviewed by me and considered in my medical decision making (see chart for details).   Viral illness, nausea with vomiting, elevated blood pressure reading.  Zofran given here and patient able to tolerate oral fluids without difficulty.  Flu and COVID pending.  Instructed patient to stay hydrated with clear liquids.  Treating at home with Zofran.  Tylenol as needed for fever or discomfort.  Instructed patient to follow-up with her PCP if her symptoms are not improving.  Discussed that her blood pressure is elevated today and needs to be rechecked by her PCP in 1 to 2 weeks.  Patient agrees to plan of care.   Final Clinical Impressions(s) / UC Diagnoses    Final diagnoses:  Viral illness  Non-intractable vomiting with nausea, unspecified vomiting type  Elevated blood pressure reading     Discharge Instructions     Take the antinausea medication as directed.    Keep yourself hydrated with  clear liquids, such as water, Gatorade, Pedialyte, Sprite, or ginger ale.    Go to the emergency department if you have acute worsening symptoms.    Your COVID and Flu tests are pending.  You should self quarantine until the test results are back.    Take Tylenol as needed for fever or discomfort.  Rest and keep yourself hydrated.    Follow-up with your primary care provider if your symptoms are not improving.    Your blood pressure is elevated today at 155/108.  Please have this rechecked by your primary care provider in 1-2 weeks.                 ED Prescriptions    Medication Sig Dispense Auth. Provider   ondansetron (ZOFRAN) 4 MG tablet Take 1 tablet (4 mg total) by mouth every 6 (six) hours as needed for nausea or vomiting. 12 tablet Mickie Bail, NP     PDMP not reviewed this encounter.   Mickie Bail, NP 01/22/20 Rickey Primus

## 2020-01-22 NOTE — ED Triage Notes (Signed)
Pt presents with productive cough, congestion, sinus pressure, and vomiting X 1 week.

## 2022-03-04 ENCOUNTER — Telehealth: Payer: Self-pay

## 2022-03-04 NOTE — Telephone Encounter (Signed)
Mychart msg sent. AS, CMA 

## 2022-07-15 DIAGNOSIS — Z87891 Personal history of nicotine dependence: Secondary | ICD-10-CM | POA: Diagnosis not present

## 2022-07-15 DIAGNOSIS — J45909 Unspecified asthma, uncomplicated: Secondary | ICD-10-CM | POA: Diagnosis not present

## 2022-12-30 ENCOUNTER — Emergency Department (HOSPITAL_COMMUNITY): Payer: 59

## 2022-12-30 ENCOUNTER — Other Ambulatory Visit: Payer: Self-pay

## 2022-12-30 ENCOUNTER — Encounter (HOSPITAL_COMMUNITY): Payer: Self-pay

## 2022-12-30 ENCOUNTER — Emergency Department (HOSPITAL_COMMUNITY)
Admission: EM | Admit: 2022-12-30 | Discharge: 2022-12-30 | Disposition: A | Discharge status: Home / Self Care | Payer: 59 | Attending: Emergency Medicine | Admitting: Emergency Medicine

## 2022-12-30 DIAGNOSIS — S161XXA Strain of muscle, fascia and tendon at neck level, initial encounter: Secondary | ICD-10-CM | POA: Diagnosis not present

## 2022-12-30 DIAGNOSIS — M79645 Pain in left finger(s): Secondary | ICD-10-CM | POA: Diagnosis not present

## 2022-12-30 DIAGNOSIS — S199XXA Unspecified injury of neck, initial encounter: Secondary | ICD-10-CM | POA: Diagnosis not present

## 2022-12-30 DIAGNOSIS — S63682A Other sprain of left thumb, initial encounter: Secondary | ICD-10-CM | POA: Diagnosis not present

## 2022-12-30 DIAGNOSIS — S20212A Contusion of left front wall of thorax, initial encounter: Secondary | ICD-10-CM | POA: Diagnosis not present

## 2022-12-30 DIAGNOSIS — R079 Chest pain, unspecified: Secondary | ICD-10-CM | POA: Diagnosis not present

## 2022-12-30 DIAGNOSIS — R0689 Other abnormalities of breathing: Secondary | ICD-10-CM | POA: Diagnosis not present

## 2022-12-30 DIAGNOSIS — I10 Essential (primary) hypertension: Secondary | ICD-10-CM | POA: Diagnosis not present

## 2022-12-30 DIAGNOSIS — Y92410 Unspecified street and highway as the place of occurrence of the external cause: Secondary | ICD-10-CM | POA: Diagnosis not present

## 2022-12-30 DIAGNOSIS — M542 Cervicalgia: Secondary | ICD-10-CM | POA: Diagnosis present

## 2022-12-30 DIAGNOSIS — S20219A Contusion of unspecified front wall of thorax, initial encounter: Secondary | ICD-10-CM | POA: Insufficient documentation

## 2022-12-30 DIAGNOSIS — S63602A Unspecified sprain of left thumb, initial encounter: Secondary | ICD-10-CM | POA: Diagnosis not present

## 2022-12-30 DIAGNOSIS — S0990XA Unspecified injury of head, initial encounter: Secondary | ICD-10-CM | POA: Insufficient documentation

## 2022-12-30 DIAGNOSIS — R109 Unspecified abdominal pain: Secondary | ICD-10-CM | POA: Diagnosis not present

## 2022-12-30 DIAGNOSIS — R000 Tachycardia, unspecified: Secondary | ICD-10-CM | POA: Diagnosis not present

## 2022-12-30 LAB — CBC WITH DIFFERENTIAL/PLATELET
Abs Immature Granulocytes: 0.03 10*3/uL (ref 0.00–0.07)
Basophils Absolute: 0 10*3/uL (ref 0.0–0.1)
Basophils Relative: 0 %
Eosinophils Absolute: 0.1 10*3/uL (ref 0.0–0.5)
Eosinophils Relative: 1 %
HCT: 46.2 % — ABNORMAL HIGH (ref 36.0–46.0)
Hemoglobin: 16.2 g/dL — ABNORMAL HIGH (ref 12.0–15.0)
Immature Granulocytes: 0 %
Lymphocytes Relative: 20 %
Lymphs Abs: 1.6 10*3/uL (ref 0.7–4.0)
MCH: 29.1 pg (ref 26.0–34.0)
MCHC: 35.1 g/dL (ref 30.0–36.0)
MCV: 83.1 fL (ref 80.0–100.0)
Monocytes Absolute: 0.5 10*3/uL (ref 0.1–1.0)
Monocytes Relative: 6 %
Neutro Abs: 5.9 10*3/uL (ref 1.7–7.7)
Neutrophils Relative %: 73 %
Platelets: 260 10*3/uL (ref 150–400)
RBC: 5.56 MIL/uL — ABNORMAL HIGH (ref 3.87–5.11)
RDW: 12.5 % (ref 11.5–15.5)
WBC: 8.1 10*3/uL (ref 4.0–10.5)
nRBC: 0 % (ref 0.0–0.2)

## 2022-12-30 LAB — BASIC METABOLIC PANEL
Anion gap: 14 (ref 5–15)
BUN: 9 mg/dL (ref 6–20)
CO2: 19 mmol/L — ABNORMAL LOW (ref 22–32)
Calcium: 10 mg/dL (ref 8.9–10.3)
Chloride: 103 mmol/L (ref 98–111)
Creatinine, Ser: 0.84 mg/dL (ref 0.44–1.00)
GFR, Estimated: >60 mL/min (ref >60)
Glucose, Bld: 96 mg/dL (ref 70–99)
Potassium: 3.8 mmol/L (ref 3.5–5.1)
Sodium: 136 mmol/L (ref 135–145)

## 2022-12-30 LAB — HCG, SERUM, QUALITATIVE: Preg, Serum: NEGATIVE

## 2022-12-30 MED ORDER — FENTANYL CITRATE PF 50 MCG/ML IJ SOSY
50.0000 ug | PREFILLED_SYRINGE | Freq: Once | INTRAMUSCULAR | Status: AC
  Administered 2022-12-30: 50 ug via INTRAVENOUS
  Filled 2022-12-30: qty 1

## 2022-12-30 MED ORDER — IBUPROFEN 800 MG PO TABS: 800.0000 mg | ORAL_TABLET | Freq: Three times a day (TID) | ORAL | 0 refills | Status: AC | PRN

## 2022-12-30 MED ORDER — METHOCARBAMOL 500 MG PO TABS: 500.0000 mg | ORAL_TABLET | Freq: Two times a day (BID) | ORAL | 0 refills | Status: AC | PRN

## 2022-12-30 MED ORDER — IOHEXOL 350 MG/ML SOLN
75.0000 mL | Freq: Once | INTRAVENOUS | Status: AC | PRN
  Administered 2022-12-30: 75 mL via INTRAVENOUS
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
## 2022-12-30 NOTE — Progress Notes (Signed)
Orthopedic Tech Progress Note Patient Details:  Maria Woodward [birth year] [id number]  Ortho Devices Type of Ortho Device: Thumb velcro splint Ortho Device/Splint Location: LUE Ortho Device/Splint Interventions: Ordered, Application, Adjustment   Post Interventions Patient Tolerated: Well Instructions Provided: Adjustment of device, Care of device  Tonye Pearson 12/30/2022, 12:08 PM
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
## 2023-08-25 ENCOUNTER — Emergency Department (HOSPITAL_BASED_OUTPATIENT_CLINIC_OR_DEPARTMENT_OTHER)

## 2023-08-25 ENCOUNTER — Emergency Department (HOSPITAL_BASED_OUTPATIENT_CLINIC_OR_DEPARTMENT_OTHER)
Admission: EM | Admit: 2023-08-25 | Discharge: 2023-08-25 | Disposition: A | Discharge status: Home / Self Care | Attending: Emergency Medicine | Admitting: Emergency Medicine

## 2023-08-25 ENCOUNTER — Other Ambulatory Visit: Payer: Self-pay

## 2023-08-25 ENCOUNTER — Encounter (HOSPITAL_BASED_OUTPATIENT_CLINIC_OR_DEPARTMENT_OTHER): Payer: Self-pay | Admitting: Emergency Medicine

## 2023-08-25 DIAGNOSIS — R443 Hallucinations, unspecified: Secondary | ICD-10-CM | POA: Diagnosis not present

## 2023-08-25 DIAGNOSIS — J45909 Unspecified asthma, uncomplicated: Secondary | ICD-10-CM | POA: Insufficient documentation

## 2023-08-25 DIAGNOSIS — I10 Essential (primary) hypertension: Secondary | ICD-10-CM | POA: Diagnosis not present

## 2023-08-25 DIAGNOSIS — R29818 Other symptoms and signs involving the nervous system: Secondary | ICD-10-CM | POA: Diagnosis not present

## 2023-08-25 LAB — CBC WITH DIFFERENTIAL/PLATELET
Abs Immature Granulocytes: 0.05 K/uL (ref 0.00–0.07)
Basophils Absolute: 0 K/uL (ref 0.0–0.1)
Basophils Relative: 0 %
Eosinophils Absolute: 0 K/uL (ref 0.0–0.5)
Eosinophils Relative: 0 %
HCT: 43.1 % (ref 36.0–46.0)
Hemoglobin: 15.3 g/dL — ABNORMAL HIGH (ref 12.0–15.0)
Immature Granulocytes: 0 %
Lymphocytes Relative: 9 %
Lymphs Abs: 1.2 K/uL (ref 0.7–4.0)
MCH: 30.2 pg (ref 26.0–34.0)
MCHC: 35.5 g/dL (ref 30.0–36.0)
MCV: 85 fL (ref 80.0–100.0)
Monocytes Absolute: 0.4 K/uL (ref 0.1–1.0)
Monocytes Relative: 3 %
Neutro Abs: 11.9 K/uL — ABNORMAL HIGH (ref 1.7–7.7)
Neutrophils Relative %: 88 %
Platelets: 262 K/uL (ref 150–400)
RBC: 5.07 MIL/uL (ref 3.87–5.11)
RDW: 13.2 % (ref 11.5–15.5)
WBC: 13.7 K/uL — ABNORMAL HIGH (ref 4.0–10.5)
nRBC: 0 % (ref 0.0–0.2)

## 2023-08-25 LAB — COMPREHENSIVE METABOLIC PANEL WITH GFR
ALT: 14 U/L (ref 0–44)
AST: 17 U/L (ref 15–41)
Albumin: 4.4 g/dL (ref 3.5–5.0)
Alkaline Phosphatase: 57 U/L (ref 38–126)
Anion gap: 12 (ref 5–15)
BUN: 5 mg/dL — ABNORMAL LOW (ref 6–20)
CO2: 19 mmol/L — ABNORMAL LOW (ref 22–32)
Calcium: 9.3 mg/dL (ref 8.9–10.3)
Chloride: 104 mmol/L (ref 98–111)
Creatinine, Ser: 0.71 mg/dL (ref 0.44–1.00)
GFR, Estimated: >60 mL/min (ref >60)
Glucose, Bld: 148 mg/dL — ABNORMAL HIGH (ref 70–99)
Potassium: 4.3 mmol/L (ref 3.5–5.1)
Sodium: 135 mmol/L (ref 135–145)
Total Bilirubin: 0.3 mg/dL (ref 0.0–1.2)
Total Protein: 7.4 g/dL (ref 6.5–8.1)

## 2023-08-25 LAB — URINALYSIS, ROUTINE W REFLEX MICROSCOPIC
Bilirubin Urine: NEGATIVE
Glucose, UA: NEGATIVE mg/dL
Ketones, ur: NEGATIVE mg/dL
Nitrite: NEGATIVE
Protein, ur: NEGATIVE mg/dL
Specific Gravity, Urine: 1.015 (ref 1.005–1.030)
pH: 7 (ref 5.0–8.0)

## 2023-08-25 LAB — URINALYSIS, MICROSCOPIC (REFLEX)

## 2023-08-25 LAB — TROPONIN T, HIGH SENSITIVITY
Troponin T High Sensitivity: <6 ng/L (ref <19)
Troponin T High Sensitivity: <15 ng/L (ref <19)

## 2023-08-25 LAB — PREGNANCY, URINE: Preg Test, Ur: NEGATIVE

## 2023-08-25 MED ORDER — IOHEXOL 350 MG/ML SOLN
75.0000 mL | Freq: Once | INTRAVENOUS | Status: AC | PRN
  Administered 2023-08-25: 75 mL via INTRAVENOUS

## 2023-08-25 NOTE — ED Triage Notes (Signed)
 Pt sts I started hallucinating at work; pt sts she was seeing black figures; coworkers checked BP and it was elevated; no hx of HTN; pt is anxious

## 2023-08-25 NOTE — Discharge Instructions (Signed)
 Keep a log of your blood pressures and take this for follow-up with your primary care doctor.  Return to the emergency room if you have any worsening symptoms.

## 2023-08-25 NOTE — ED Provider Notes (Signed)
 [place] [facility] Provider Note   CSN: [id number] Arrival date & time: 08/25/23  1554     Patient presents with: Hypertension   Maria Woodward is a 28 y.o. female.   Patient is a 28 year old female who presents with elevated blood pressure and hallucinations.  She states that she does have a history of anxiety and was having a lot of anxiety at work today.  She works in a call center.  She says that she thought life in general was just making her anxious.  She started seeing black figures coming out of the computer screen.  She has never had hallucinations before.  She says they are actual figures and not just black splotches.  She has not had any other vision changes.  She said that lasts about an hour and now she is not having any hallucinations.  She feels like her anxiety is a little bit better.  She had some shortness of breath during this anxiety attack but no other shortness of breath.  No chest pain.  No recent fevers.  No cough or cold symptoms.  No urinary symptoms.  No thoughts of self-harm.  No SI/HI.  No history of hallucinations in the past.  She also says she has had some intermittent tingling in her left hand for the last few months.  She denies any weakness in her extremities.  No double vision or blind spots.  No slurred speech or difficulty getting her words out.  She had a headache earlier but says that has since resolved.  She sees her blood pressure has been high for a long time but she has not been put on blood pressure medication.  She feels like she needs to be started on blood pressure medication.       Prior to Admission medications   Medication Sig Start Date End Date Taking? Authorizing Provider  acetaminophen  (TYLENOL ) 500 MG tablet Take 500-1,000 mg by mouth every 6 (six) hours as needed for moderate pain (pain score 4-6).    [provider]  ibuprofen  (ADVIL ) 800 MG tablet Take 1 tablet (800 mg total) by mouth  every 8 (eight) hours as needed. 12/30/22   Towana Ozell BROCKS, MD  methocarbamol  (ROBAXIN ) 500 MG tablet Take 1 tablet (500 mg total) by mouth 2 (two) times daily as needed for muscle spasms. 12/30/22   Towana Ozell BROCKS, MD  PROAIR  HFA 108 (90 Base) MCG/ACT inhaler Inhale 2 puffs into the lungs every 4 (four) hours as needed for wheezing.  05/27/15   [provider]    Allergies: Patient has no known allergies.    Review of Systems  Constitutional:  Negative for chills, diaphoresis, fatigue and fever.  HENT:  Negative for congestion, rhinorrhea and sneezing.   Eyes: Negative.   Respiratory:  Negative for cough, chest tightness and shortness of breath.   Cardiovascular:  Negative for chest pain and leg swelling.  Gastrointestinal:  Negative for abdominal pain, blood in stool, diarrhea, nausea and vomiting.  Genitourinary:  Negative for difficulty urinating, flank pain, frequency and hematuria.  Musculoskeletal:  Negative for arthralgias and back pain.  Skin:  Negative for rash.  Neurological:  Positive for numbness and headaches. Negative for dizziness, speech difficulty and weakness.  Psychiatric/Behavioral:  Positive for hallucinations. Negative for suicidal ideas. The patient is nervous/anxious.     Updated Vital Signs BP (!) 120/92   Pulse 84   Temp 97.9 F (36.6 C) (Oral)   Resp 16  Ht 5' (1.524 m)   Wt 68 kg   SpO2 100%   BMI 29.29 kg/m   Physical Exam Constitutional:      Appearance: She is well-developed.  HENT:     Head: Normocephalic and atraumatic.  Eyes:     Pupils: Pupils are equal, round, and reactive to light.  Cardiovascular:     Rate and Rhythm: Normal rate and regular rhythm.     Heart sounds: Normal heart sounds.  Pulmonary:     Effort: Pulmonary effort is normal. No respiratory distress.     Breath sounds: Normal breath sounds. No wheezing or rales.  Chest:     Chest wall: No tenderness.  Abdominal:     General: Bowel sounds are normal.      Palpations: Abdomen is soft.     Tenderness: There is no abdominal tenderness. There is no guarding or rebound.  Musculoskeletal:        General: Normal range of motion.     Cervical back: Normal range of motion and neck supple.  Lymphadenopathy:     Cervical: No cervical adenopathy.  Skin:    General: Skin is warm and dry.     Findings: No rash.  Neurological:     Mental Status: She is alert and oriented to person, place, and time.     Comments: Motor 5 out of 5 all extremities, sensation is slightly different to light touch in her left arm as compared to the right.  No facial drooping, extraocular eye movements are intact, normal tongue protrusion, normal shoulder shrug.  She has some slightly diminished sensation to light touch in the left side of her face including the forehead, finger-nose intact, no pronator drift     (all labs ordered are listed, but only abnormal results are displayed) Labs Reviewed  CBC WITH DIFFERENTIAL/PLATELET - Abnormal; Notable for the following components:      Result Value   WBC 13.7 (*)    Hemoglobin 15.3 (*)    Neutro Abs 11.9 (*)    All other components within normal limits  COMPREHENSIVE METABOLIC PANEL WITH GFR - Abnormal; Notable for the following components:   CO2 19 (*)    Glucose, Bld 148 (*)    BUN 5 (*)    All other components within normal limits  URINALYSIS, ROUTINE W REFLEX MICROSCOPIC - Abnormal; Notable for the following components:   Hgb urine dipstick TRACE (*)    Leukocytes,Ua SMALL (*)    All other components within normal limits  URINALYSIS, MICROSCOPIC (REFLEX) - Abnormal; Notable for the following components:   Bacteria, UA RARE (*)    All other components within normal limits  PREGNANCY, URINE  TROPONIN T, HIGH SENSITIVITY  TROPONIN T, HIGH SENSITIVITY    EKG: EKG Interpretation Date/Time:  Wednesday August 25 2023 16:02:26 EDT Ventricular Rate:  116 PR Interval:  102 QRS Duration:  81 QT Interval:  314 QTC  Calculation: 437 R Axis:   76  Text Interpretation: Sinus tachycardia No old tracing to compare Confirmed by Lenor Hollering [phone]) on 08/25/2023 4:04:07 PM  Radiology: CT ANGIO HEAD NECK W WO CM Result Date: 08/25/2023 CLINICAL DATA:  Neuro deficit, acute, stroke suspected EXAM: CT ANGIOGRAPHY HEAD AND NECK WITH AND WITHOUT CONTRAST TECHNIQUE: Multidetector CT imaging of the head and neck was performed using the standard protocol during bolus administration of intravenous contrast. Multiplanar CT image reconstructions and MIPs were obtained to evaluate the vascular anatomy. Carotid stenosis measurements (when applicable) are obtained utilizing  NASCET criteria, using the distal internal carotid diameter as the denominator. RADIATION DOSE REDUCTION: This exam was performed according to the departmental dose-optimization program which includes automated exposure control, adjustment of the mA and/or kV according to patient size and/or use of iterative reconstruction technique. CONTRAST:  75mL OMNIPAQUE  IOHEXOL  350 MG/ML SOLN COMPARISON:  None Available. FINDINGS: CT HEAD FINDINGS Brain: No evidence of acute infarction, hemorrhage, hydrocephalus, extra-axial collection or mass lesion/mass effect. Vascular: See below. Skull: No acute fracture. Sinuses/Orbits: Clear sinuses.  No acute orbital findings. Other: No mastoid effusions. Review of the MIP images confirms the above findings CTA NECK FINDINGS Aortic arch: Great vessel origins are patent without significant stenosis. Right carotid system: No evidence of dissection, stenosis (50% or greater), or occlusion. Left carotid system: No evidence of dissection, stenosis (50% or greater), or occlusion. Vertebral arteries: Left dominant. No evidence of dissection, stenosis (50% or greater), or occlusion. Skeleton: No acute fracture on limited assessment. Other neck: No acute abnormality on limited assessment. Upper chest: Visualized lung apices are clear. Review of the  MIP images confirms the above findings CTA HEAD FINDINGS Anterior circulation: Bilateral intracranial ICAs, MCAs and ACAs are patent without proximal hemodynamically significant stenosis. No aneurysm identified. Posterior circulation: Bilateral intradural vertebral arteries, basilar artery and bilateral posterior cerebral arteries are patent without proximal hemodynamically significant stenosis. The non dominant right vertebral artery largely terminates as PICA, anatomic variant. Right fetal type PCA, anatomic variant. Venous sinuses: As permitted by contrast timing, patent. Anatomic variants: Detailed above. Review of the MIP images confirms the above findings IMPRESSION: No large vessel occlusion or proximal hemodynamically significant stenosis. Electronically Signed   By: Gilmore GORMAN Molt M.D.   On: 08/25/2023 20:24     Procedures   Medications Ordered in the ED  iohexol  (OMNIPAQUE ) 350 MG/ML injection 75 mL (75 mLs Intravenous Contrast Given 08/25/23 1945)                                    Medical Decision Making Amount and/or Complexity of Data Reviewed Labs: ordered. Radiology: ordered.  Risk Prescription drug management.   This patient presents to the ED for concern of elevated blood pressure and hallucinations, this involves an extensive number of treatment options, and is a complaint that carries with it a high risk of complications and morbidity.  I considered the following differential and admission for this acute, potentially life threatening condition.  The differential diagnosis includes uncontrolled hypertension, ACS, carotid dissection, anxiety, panic attack, stroke, schizophrenia  MDM:    Patient is a 28 year old who presents with anxiety and elevated blood pressure.  She also has some associated hallucinations that last for about an hour but she has not had any further since that time.  She did have some numbness in the left face although it was in all nerve distributions  as well as her left arm.  She does not have any other neurologic deficits that would be more concerning for stroke.  She had a CTA of the head and neck that did not show any evidence of occlusions, dissection or brain mass.  No obvious stroke or intracranial hemorrhage.  Labs are overall nonconcerning.  Patient's blood pressure improved without any treatment in the ED.  It has come down to 120/92.  Given this, I do not feel that we should start her on antihypertensive medication.  I did advise her to have close follow-up with her PCP  and in the meantime to keep a log of her blood pressures at home.  She was discharged home in good condition.  Return precautions were given.  (Labs, imaging, consults)  Labs: I Ordered, and personally interpreted labs.  The pertinent results include: Negative pregnancy test, glucose is mildly elevated, no significant electrolyte abnormality  Imaging Studies ordered: I ordered imaging studies including CTA of the head and neck I independently visualized and interpreted imaging. I agree with the radiologist interpretation  Additional history obtained from chart.  External records from outside source obtained and reviewed including care notes  Cardiac Monitoring: The patient was maintained on a cardiac monitor.  If on the cardiac monitor, I personally viewed and interpreted the cardiac monitored which showed an underlying rhythm of: Sinus rhythm  Reevaluation: After the interventions noted above, I reevaluated the patient and found that they have :improved  Social Determinants of Health:  anxiety  Disposition: Discharged to home  Co morbidities that complicate the patient evaluation  Past Medical History:  Diagnosis Date   Asthma      Medicines Meds ordered this encounter  Medications   iohexol  (OMNIPAQUE ) 350 MG/ML injection 75 mL    I have reviewed the patients home medicines and have made adjustments as needed  Problem List / ED Course: Problem  List Items Addressed This Visit   None Visit Diagnoses       Hypertension, unspecified type    -  Primary     Hallucinations                    Final diagnoses:  Hypertension, unspecified type  Hallucinations    ED Discharge Orders     None          Lenor Hollering, MD 08/25/23 2101

## 2023-08-25 NOTE — ED Notes (Signed)
Patient ambulatory to bathroom without assistance.
